# Patient Record
Sex: Female | Born: 1955 | Race: White | Hispanic: No | Marital: Single
Health system: Midwestern US, Community
[De-identification: ages and names within clinical notes are randomized; demographics above are authoritative.]

## PROBLEM LIST (undated history)

## (undated) DIAGNOSIS — M65321 Trigger finger, right index finger: Secondary | ICD-10-CM

## (undated) DIAGNOSIS — M19041 Primary osteoarthritis, right hand: Secondary | ICD-10-CM

## (undated) DIAGNOSIS — M19042 Primary osteoarthritis, left hand: Secondary | ICD-10-CM

---

## 2019-05-31 IMAGING — MR MRI JOINT OF LOWER EXTREMITY W/CONTRAST LT
4 of 5 series · 31 of 40 positions shown · IV contrast (Gadavist)
Comparison: None available.

EXAM:  MRI JOINT OF LOWER EXTREMITY W/CONTRAST LT
INDICATION: Left knee pain and swelling for several months.
TECHNIQUE: Multiplanar multisequential MRI of the left knee joint was performed following intra-articular administration of 40 mL of diluted Gadavist.

[Series 5: T1 fat-sat · axial · left · 4.0mm · 0.42mm/px · z∈[-69,+43]mm · 9 of 26 slices shown (1 of 2)]
[im 1/26]
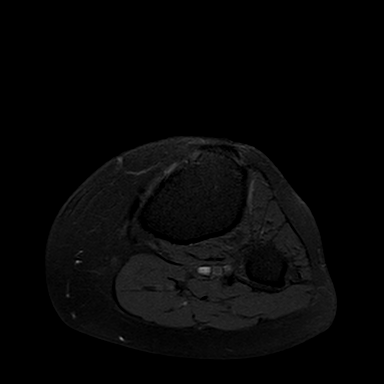
[im 4/26]
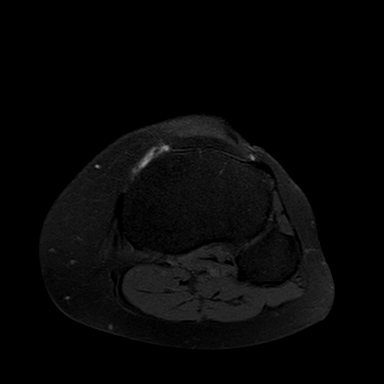
[im 7/26]
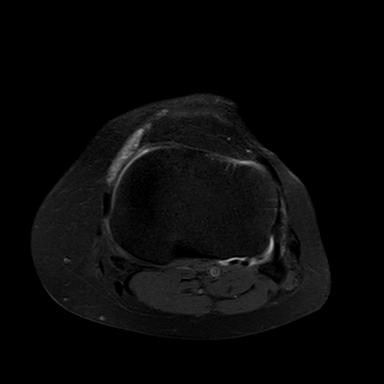
[im 10/26]
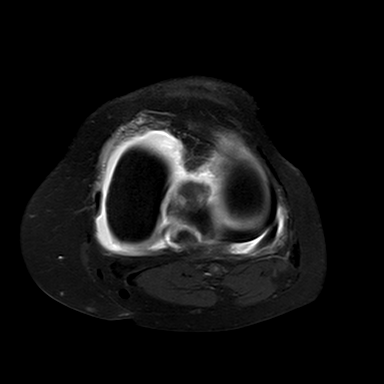
[im 13/26]
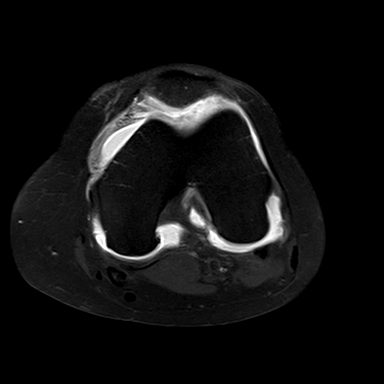
[im 16/26]
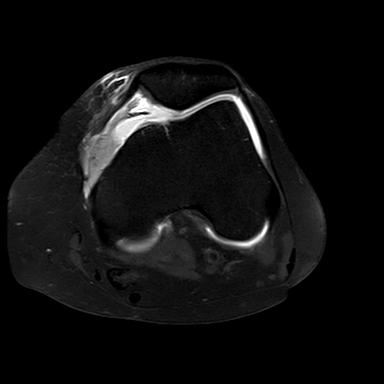
[im 19/26]
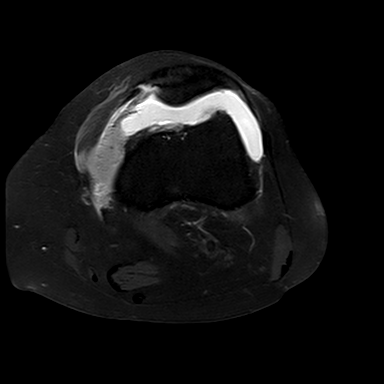
[im 22/26]
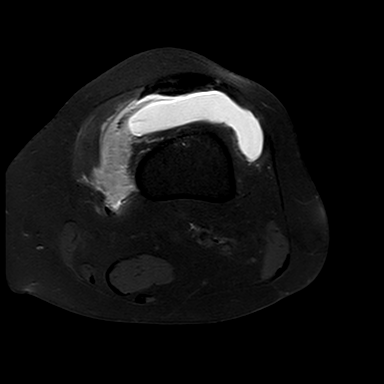
[im 26/26]
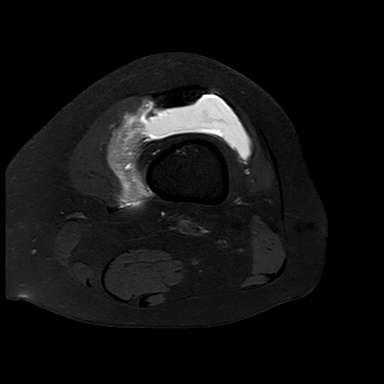

[Series 8: T1 fat-sat · sagittal · left · 4.0mm · 0.53mm/px · 9 of 26 slices shown (2 of 2)]
[im 1/26]
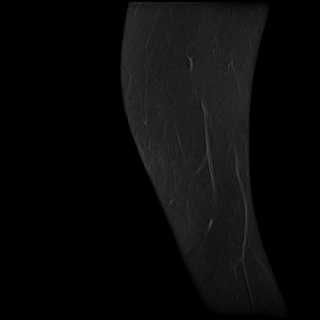
[im 4/26]
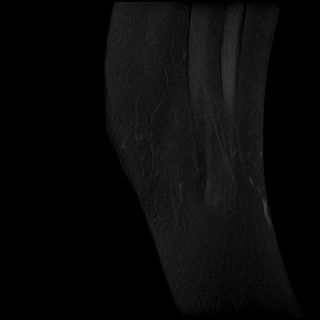
[im 7/26]
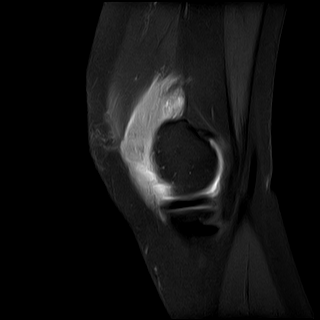
[im 10/26]
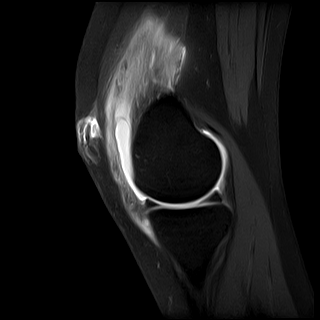
[im 13/26]
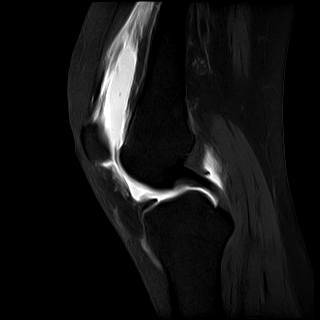
[im 16/26]
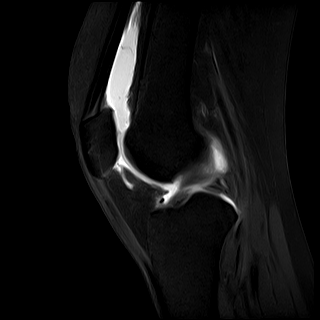
[im 19/26]
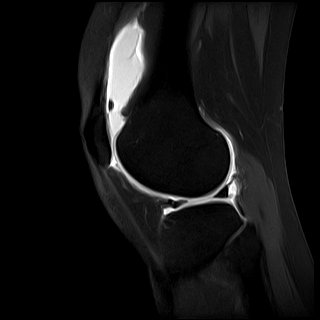
[im 22/26]
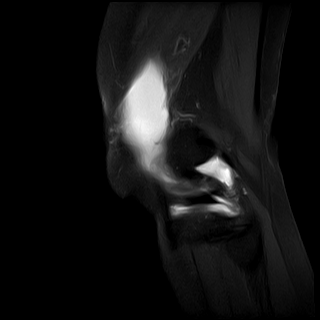
[im 26/26]
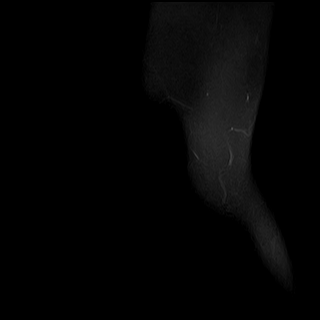

[Series 9: PD fat-sat · sagittal · left · 4.0mm · 0.33mm/px · 9 of 26 slices shown]
[im 1/26]
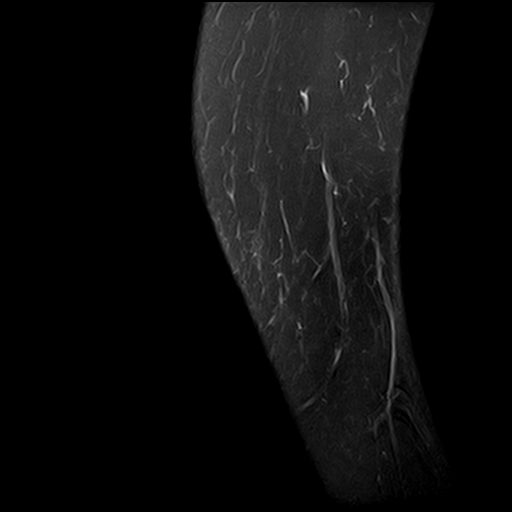
[im 4/26]
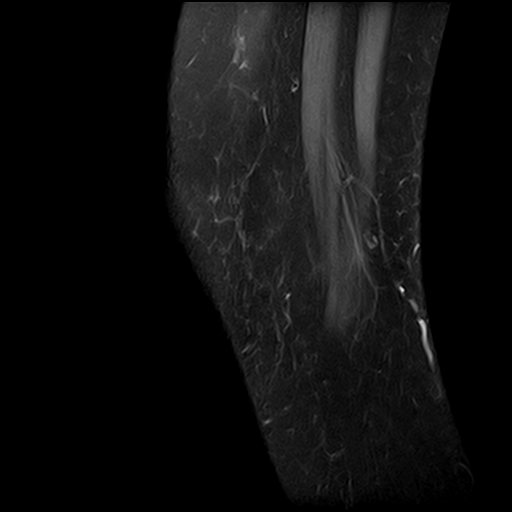
[im 7/26]
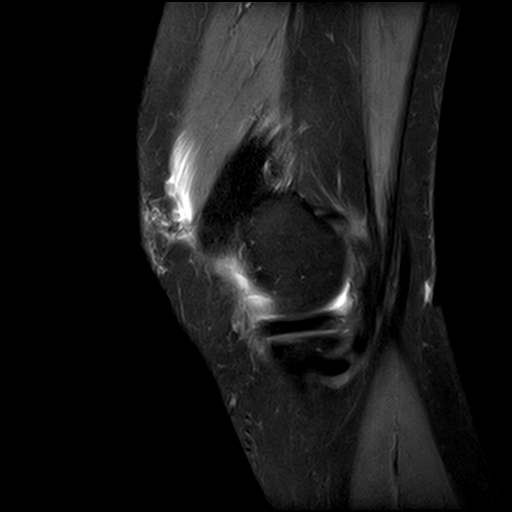
[im 10/26]
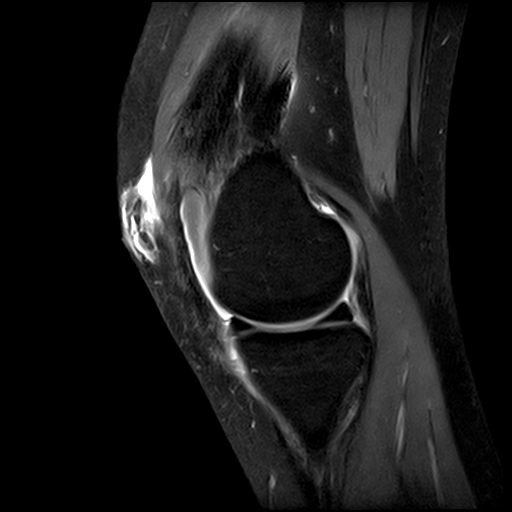
[im 13/26]
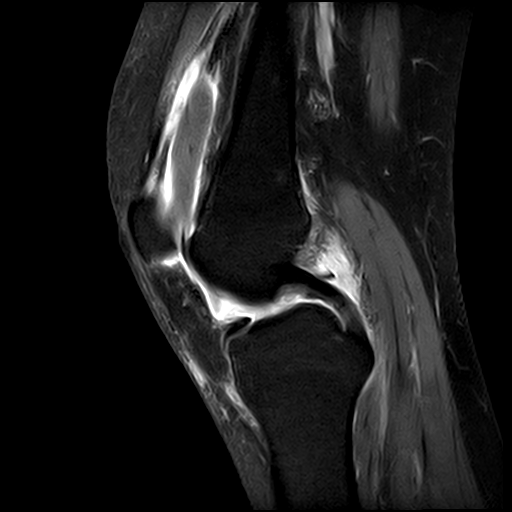
[im 16/26]
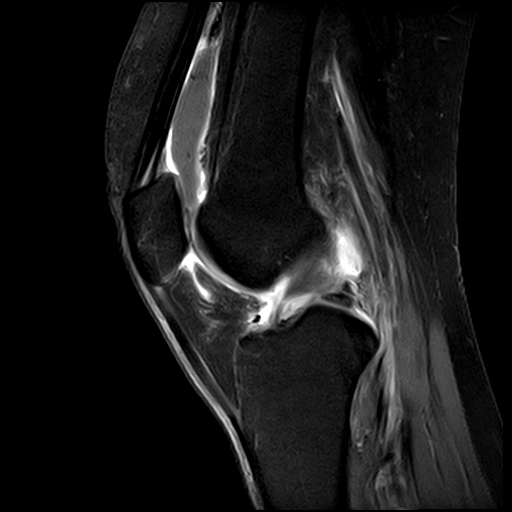
[im 19/26]
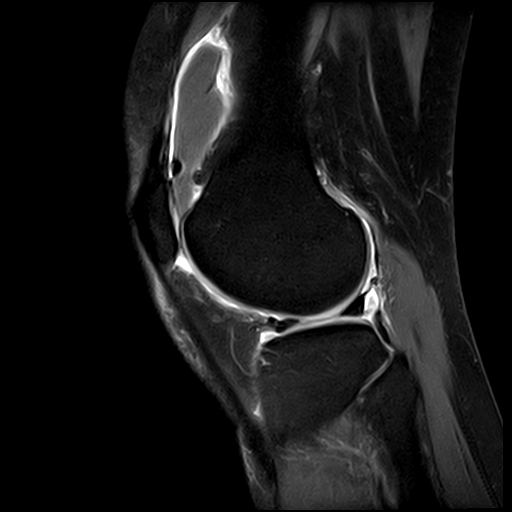
[im 22/26]
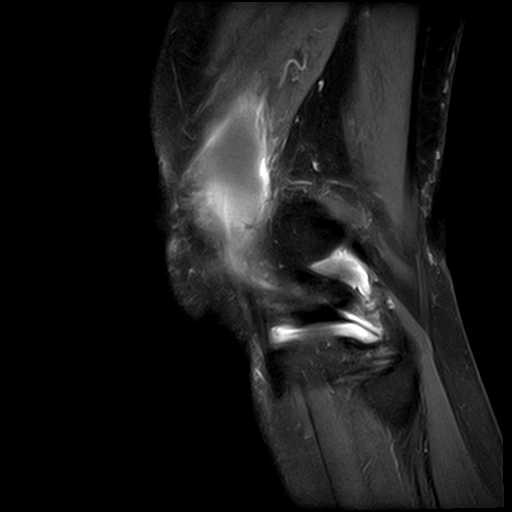
[im 26/26]
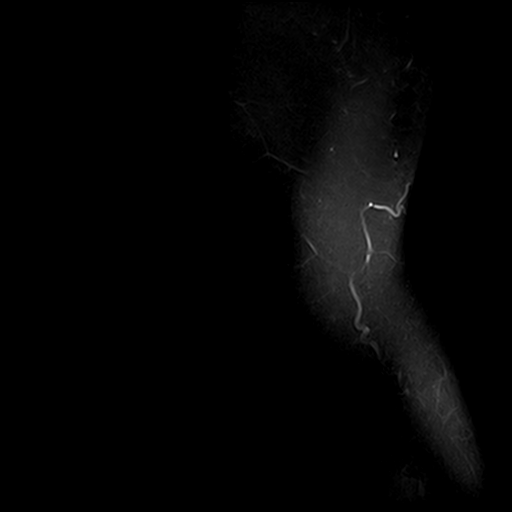

[Series 11: PD · sagittal · left · 2.0mm · 0.47mm/px · 4 of 11 slices shown]
[im 1/11]
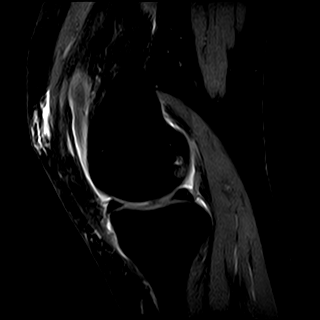
[im 4/11]
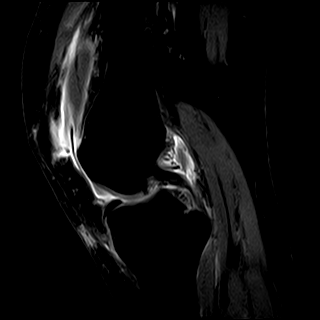
[im 7/11]
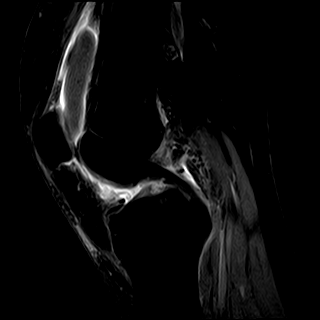
[im 11/11]
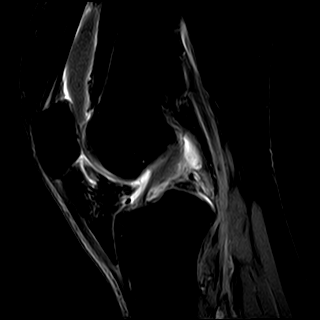

[31 of 40 positions shown; findings below may reference images not displayed]

FINDINGS: There is suggestion of a subtle horizontal tear involving the body of the lateral meniscus. Medial meniscus, cruciate and collateral ligaments are intact, within normal limits in morphology and signal intensity. Hyaline cartilage of the tibiofemoral and patellofemoral articulations is well maintained. Extensor mechanism is intact. Capsular attachments appear unremarkable. Bone marrow signal intensity is normal. There is no Baker's cyst.
IMPRESSION: Suggestion of a subtle horizontal tear involving the body of the lateral meniscus.

## 2020-03-27 IMAGING — MR MRI JOINT OF LOWER EXTREMITY W/CONTRAST RT
5 series · 31 of 40 positions shown · IV contrast (Gadavist)
Comparison: None available.

﻿EXAM:  MRI JOINT OF LOWER EXTREMITY W/CONTRAST RT
INDICATION: Right knee pain.
TECHNIQUE: Multiplanar multisequential MRI of the right knee joint was performed following intra-articular administration of 20 mL of diluted Gadavist.

[Series 5: T1 fat-sat · axial · right · 4.0mm · 0.42mm/px · z∈[-49,+68]mm · 8 of 27 slices shown (1 of 3)]
[im 1/27]
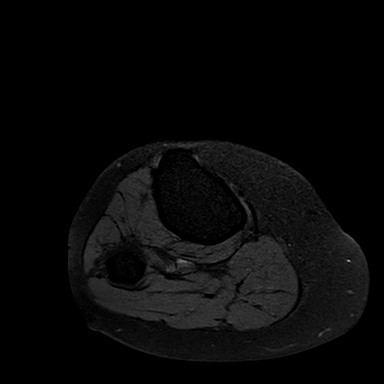
[im 3/27]
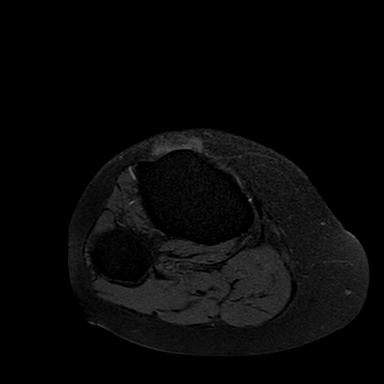
[im 9/27]
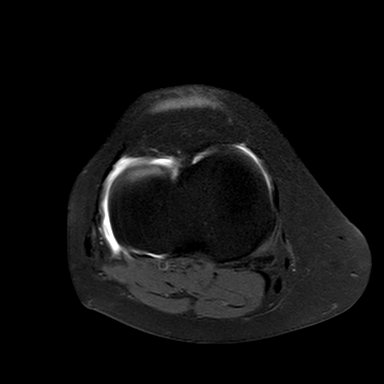
[im 12/27]
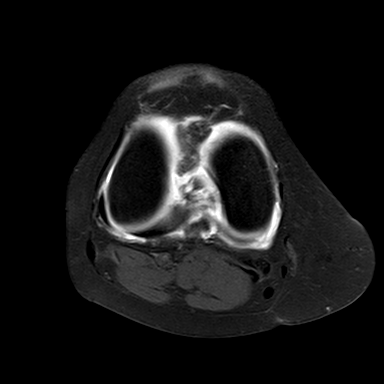
[im 15/27]
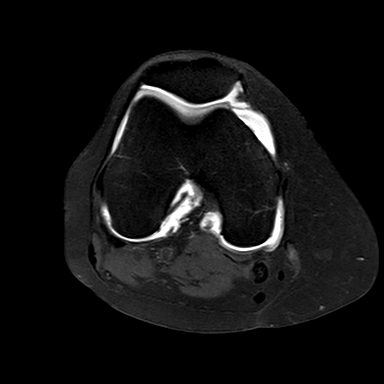
[im 18/27]
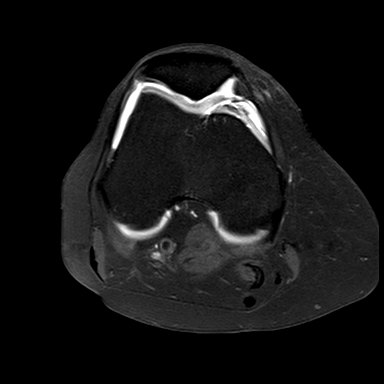
[im 24/27]
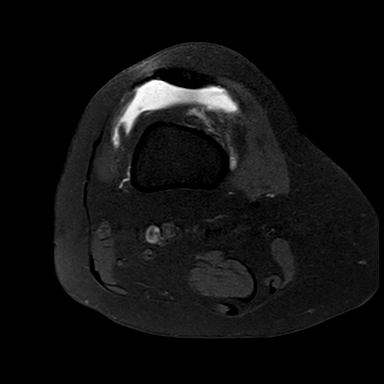
[im 27/27]
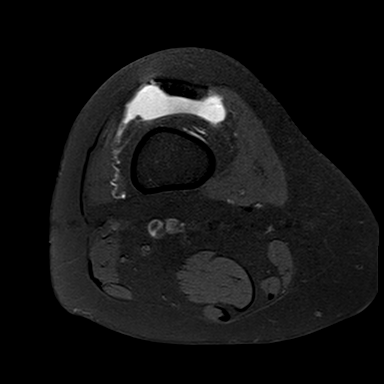

[Series 6: T1 fat-sat · sagittal · right · 4.0mm · 0.53mm/px · 8 of 23 slices shown (2 of 3)]
[im 1/23]
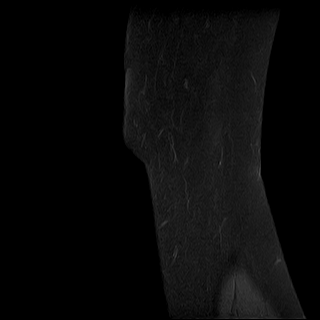
[im 4/23]
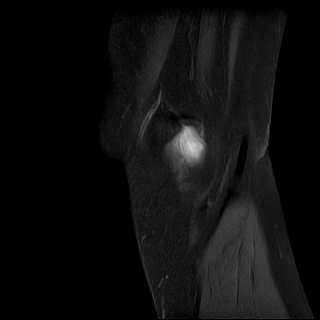
[im 7/23]
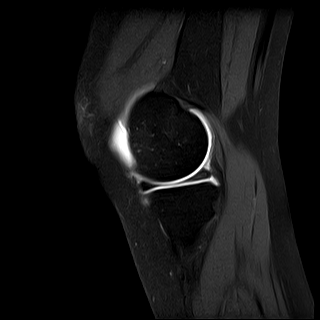
[im 10/23]
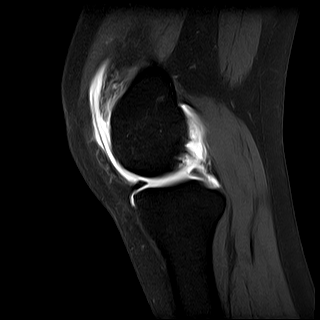
[im 13/23]
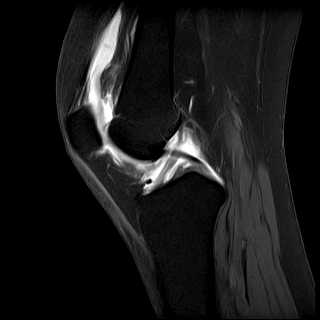
[im 16/23]
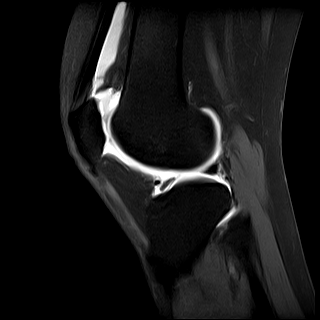
[im 19/23]
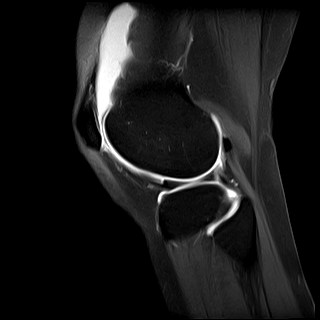
[im 23/23]
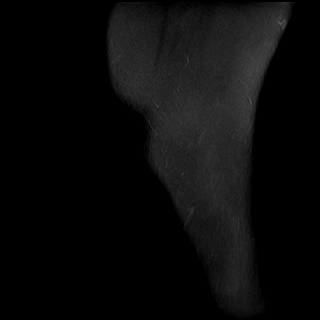

[Series 7: PD fat-sat · sagittal · right · 4.0mm · 0.33mm/px · 8 of 23 slices shown]
[im 1/23]
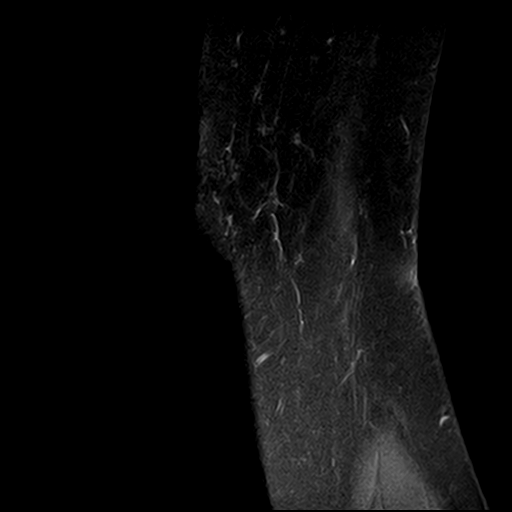
[im 4/23]
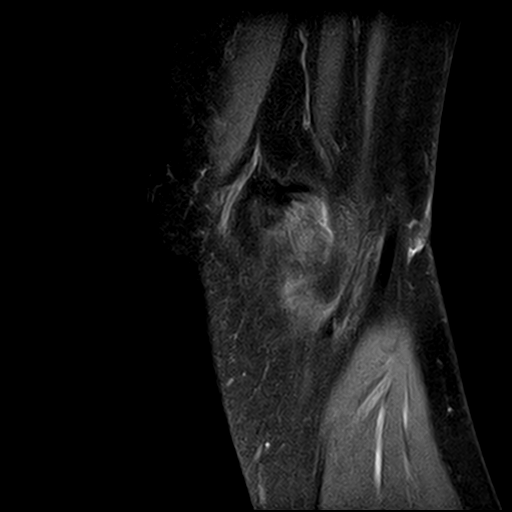
[im 7/23]
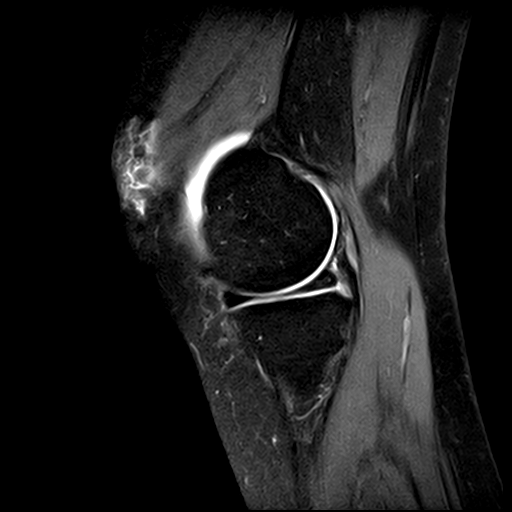
[im 10/23]
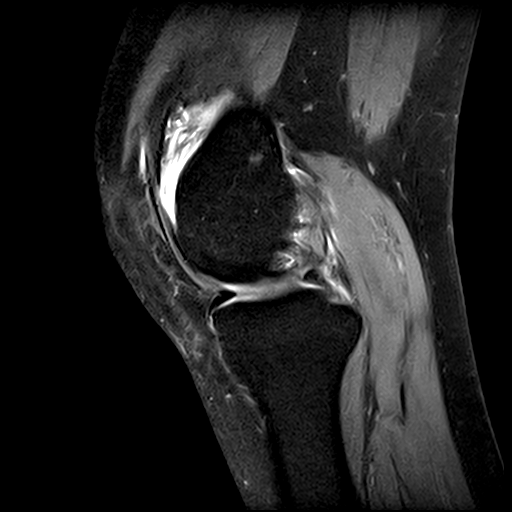
[im 13/23]
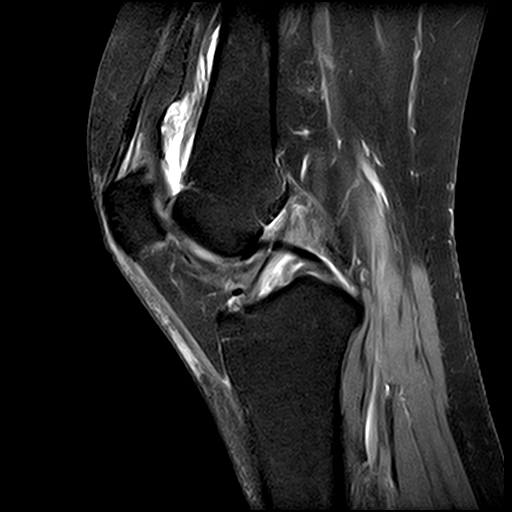
[im 16/23]
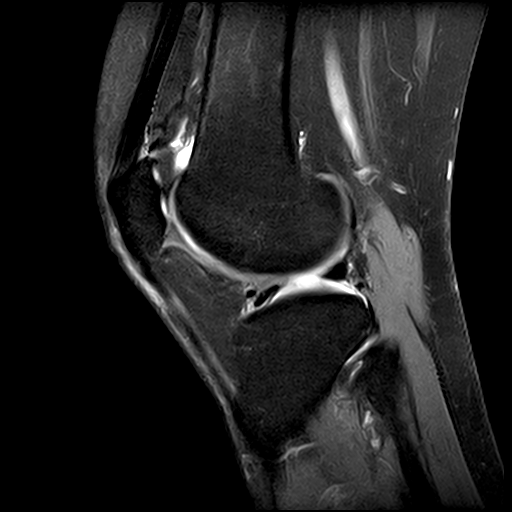
[im 19/23]
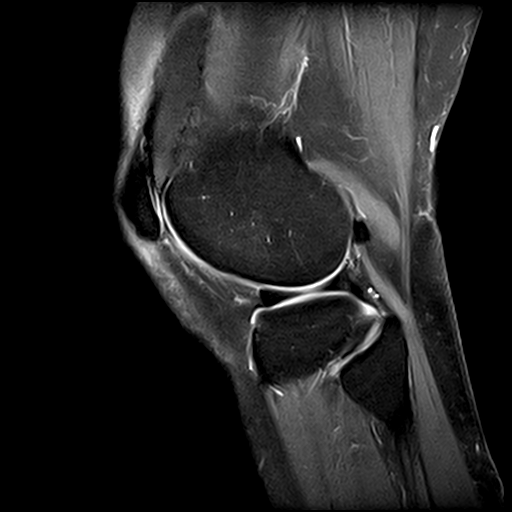
[im 23/23]
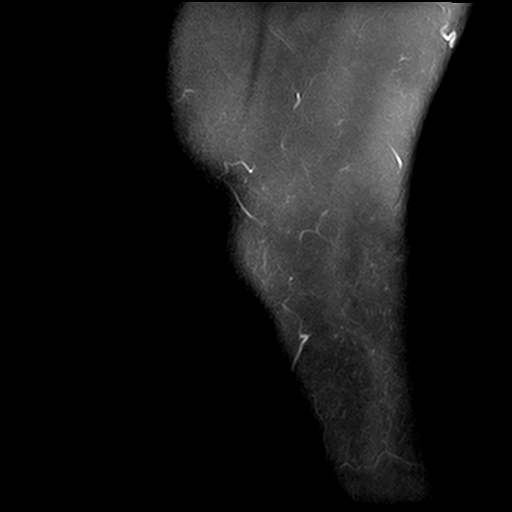

[Series 8: T1 fat-sat · coronal · right · 4.0mm · 0.38mm/px · 3 of 27 slices shown (3 of 3)]
[im 1/27]
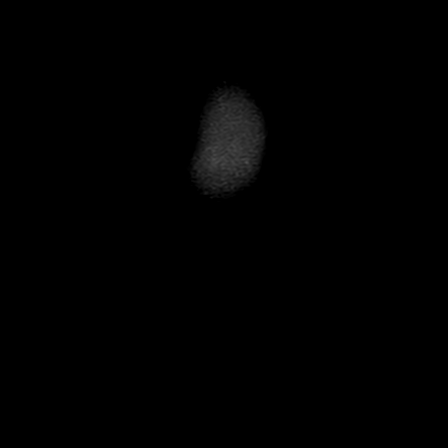
[im 3/27]
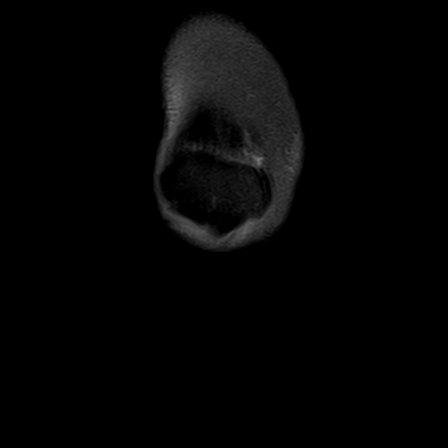
[im 9/27]
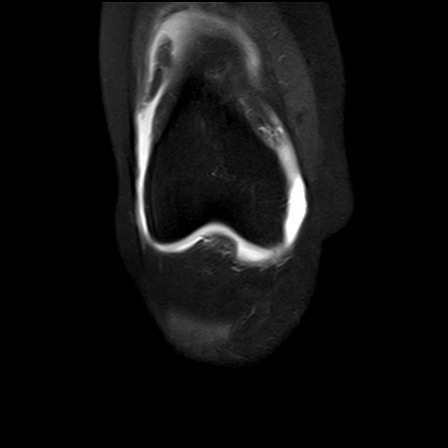

[Series 9: PD · sagittal · right · 2.0mm · 0.47mm/px · 4 of 11 slices shown]
[im 1/11]
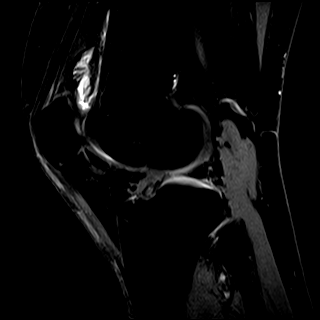
[im 4/11]
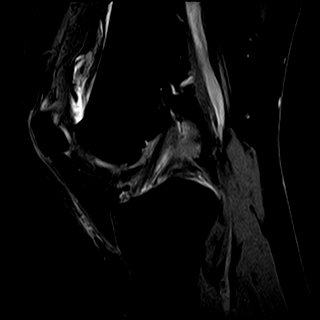
[im 7/11]
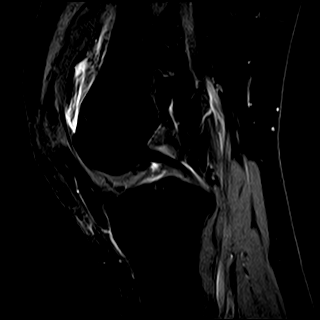
[im 11/11]
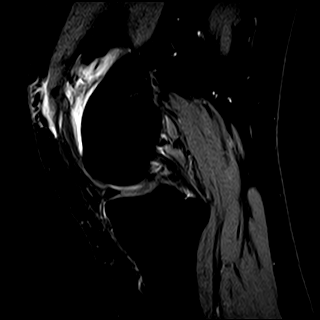

[31 of 40 positions shown; findings below may reference images not displayed]

FINDINGS: There is suggestion of a subtle tear involving the body of the medial meniscus. Lateral meniscus, cruciate and collateral ligaments are intact, within normal limits in morphology and signal intensity. Hyaline cartilage of the tibiofemoral and patellofemoral articulations is well maintained. Extensor mechanism is intact. Capsular attachments appear unremarkable. Bone marrow signal intensity is normal. There is no Baker's cyst.
IMPRESSION: Suggestion of a subtle tear involving the body of the medial meniscus.

## 2021-01-14 IMAGING — MR MRI WRIST LT W/O CONTRAST
5 of 7 series · 30 of 40 positions shown · IV contrast (gadolinium)
Comparison: None available.

﻿EXAM:  [id number]   MRI WRIST LT W/O CONTRAST
INDICATION: Pain and swelling.
TECHNIQUE: Multiplanar multisequential MRI of the left wrist joint was performed without gadolinium contrast.

[Series 6: T1 · axial · left · 3.0mm · 0.34mm/px · z∈[-21,+42]mm · 6 of 20 slices shown (1 of 2)]
[im 1/20]
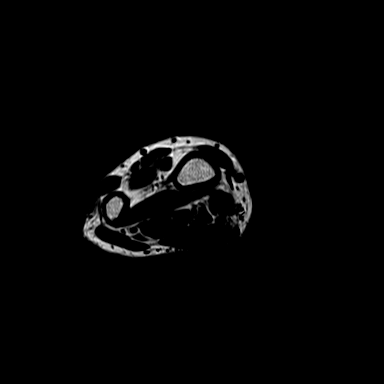
[im 4/20]
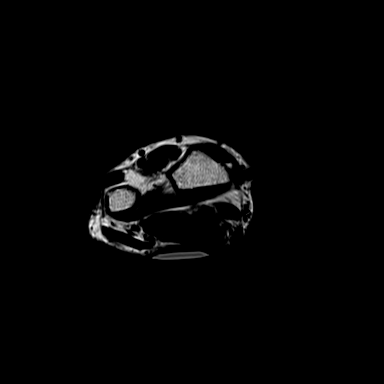
[im 8/20]
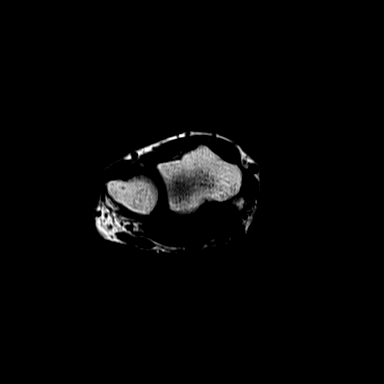
[im 12/20]
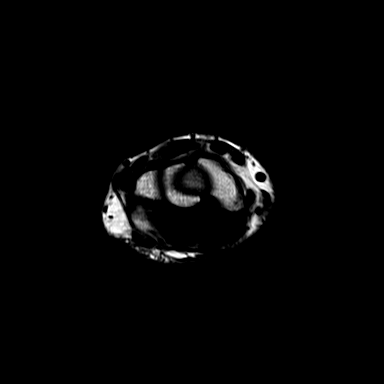
[im 16/20]
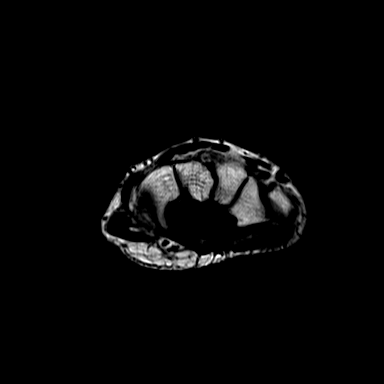
[im 20/20]
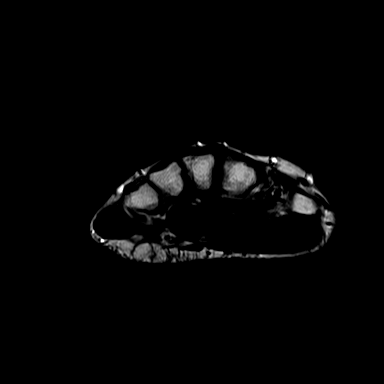

[Series 8: PD fat-sat · axial · left · 3.0mm · 0.29mm/px · z∈[-30,+46]mm · 7 of 24 slices shown (1 of 2)]
[im 1/24]
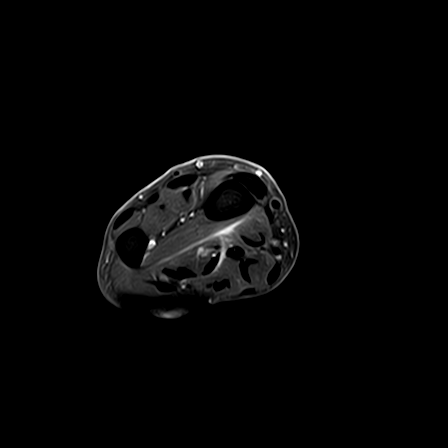
[im 4/24]
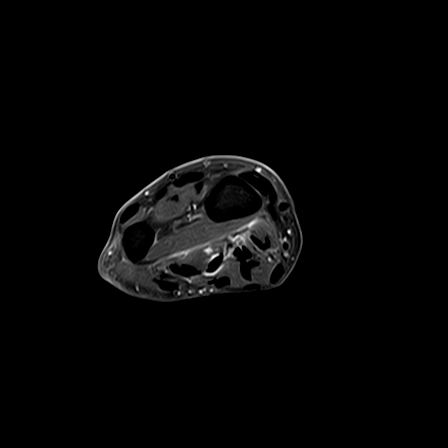
[im 8/24]
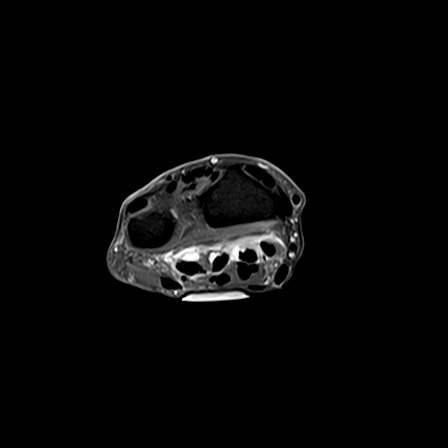
[im 12/24]
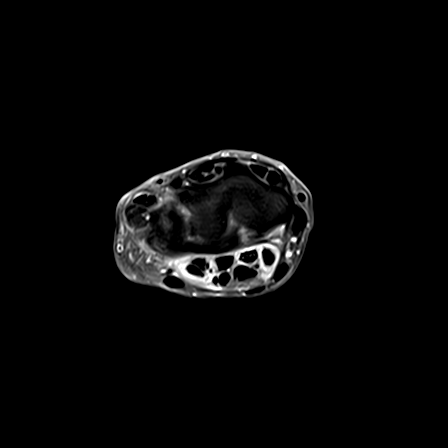
[im 16/24]
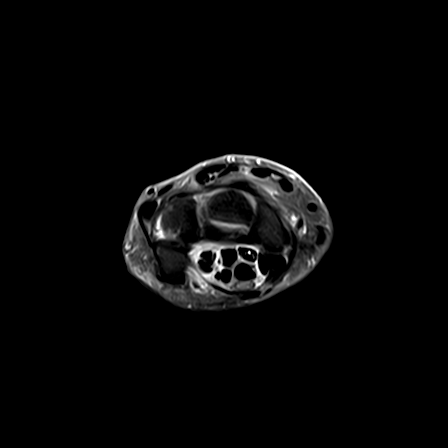
[im 20/24]
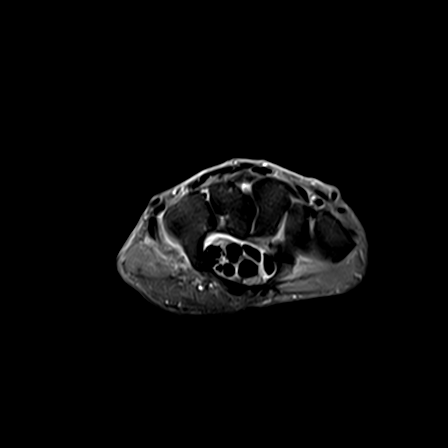
[im 24/24]
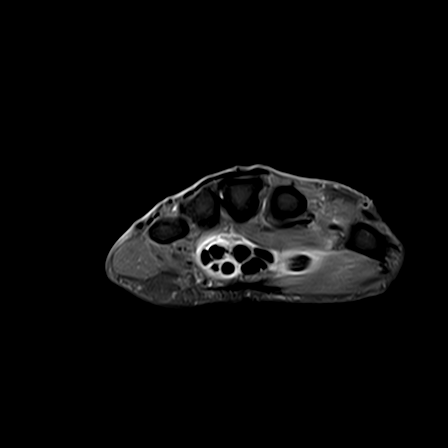

[Series 9: T1 · axial · left · 3.0mm · 0.34mm/px · z∈[-30,+46]mm · 7 of 24 slices shown (2 of 2)]
[im 1/24]
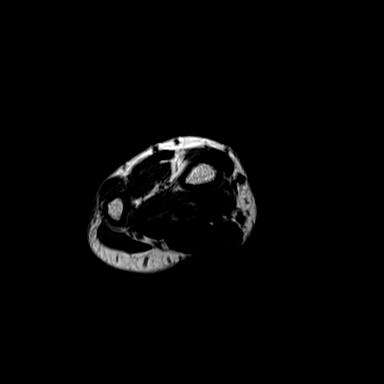
[im 4/24]
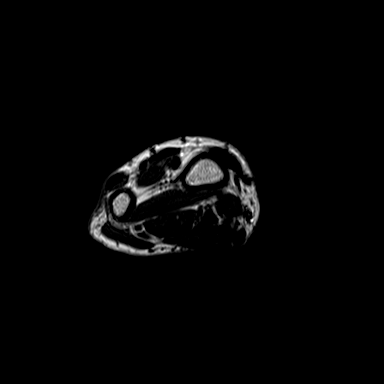
[im 8/24]
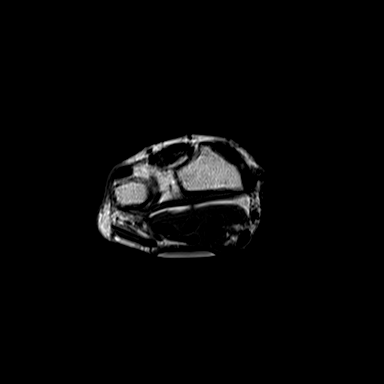
[im 12/24]
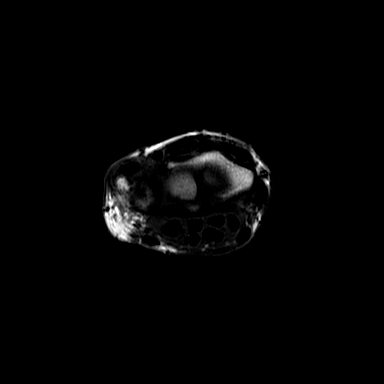
[im 16/24]
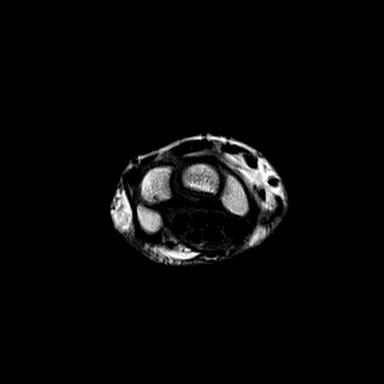
[im 20/24]
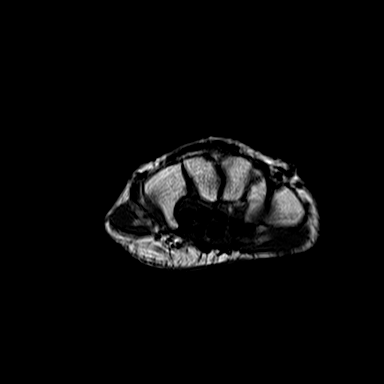
[im 24/24]
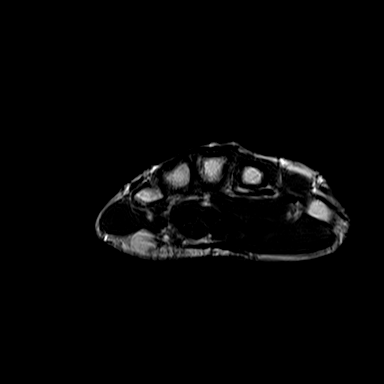

[Series 11: T2 fat-sat · sagittal · left · 3.0mm · 0.29mm/px · 6 of 22 slices shown]
[im 1/22]
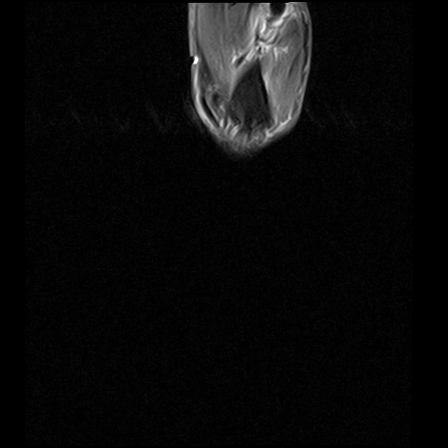
[im 5/22]
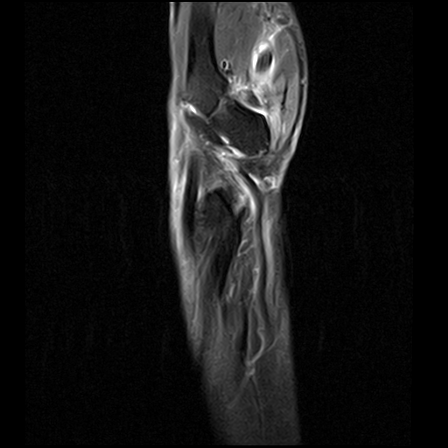
[im 9/22]
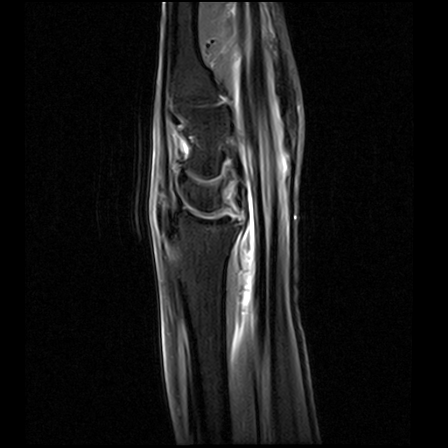
[im 13/22]
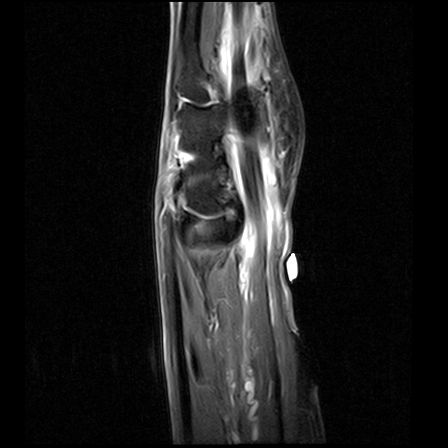
[im 17/22]
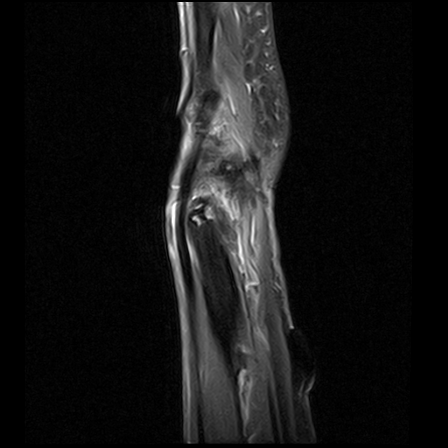
[im 22/22]
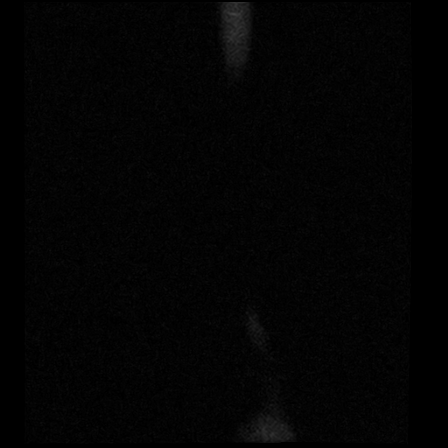

[Series 13: PD fat-sat · coronal · left · 3.5mm · 0.29mm/px · 4 of 14 slices shown (2 of 2)]
[im 1/14]
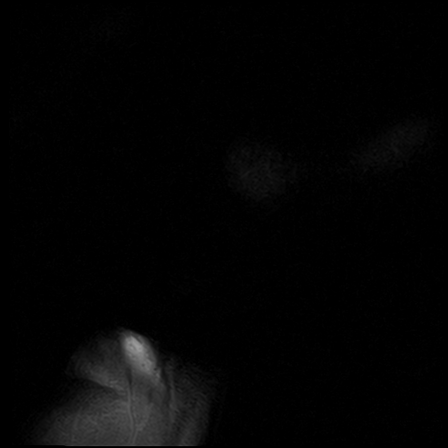
[im 5/14]
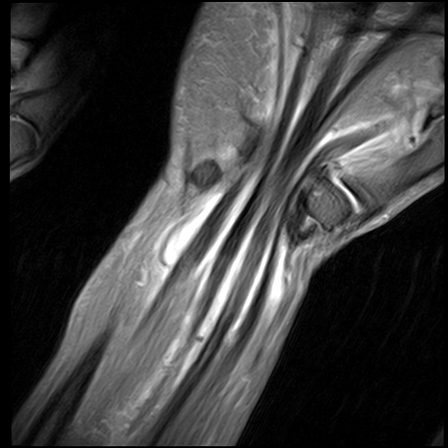
[im 9/14]
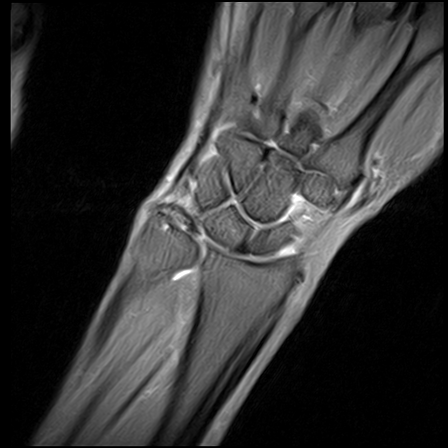
[im 14/14]
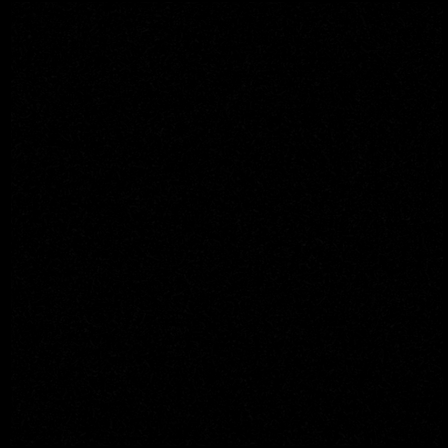

[30 of 40 positions shown; findings below may reference images not displayed]

FINDINGS: Examination is somewhat degraded due to excessive patient motion. Bone marrow signal intensity is normal. There is no acute fracture or subluxation. No significant arthritic changes are noted. Scapholunate and lunotriquetral ligaments are intact. There is a complex triangular fibrocartilage tear. Moderate amount of fluid is noted within all flexor tendon sheaths. Extensor tendons are normal. The median nerve appears unremarkable. There is no subcutaneous edema.
IMPRESSION: 1. Moderate amount of fluid within flexor tendon sheaths consistent with tenosynovitis. Continued follow-up is recommended. 

2. Complex triangular fibrocartilage tear.

## 2022-01-08 ENCOUNTER — Encounter: Payer: MEDICARE | Attending: Specialist

## 2022-01-08 DIAGNOSIS — M19042 Primary osteoarthritis, left hand: Secondary | ICD-10-CM

## 2022-01-08 NOTE — Progress Notes (Unsigned)
HPI: Jade Singleton (DOB: [birth year]) is a 66 y.o. female, patient, here for evaluation of the following chief complaint(s):    No chief complaint on file.   ***    Vitals:  There were no vitals taken for this visit.    There is no height or weight on file to calculate BMI.    Not on File    No current outpatient medications on file.     No current facility-administered medications for this visit.        No past medical history on file.     No past surgical history on file.    No family history on file.           Review of Systems   All other systems reviewed and are negative.           Physical Exam    ***    Imaging:    ***    ASSESSMENT/PLAN:  Below is the assessment and plan developed based on review of pertinent history, physical exam, labs, studies, and medications.    ***    1. Primary osteoarthritis of both hands  2. Left hand pain      No follow-ups on file.     An electronic signature was used to authenticate this note.  -- Leandro Reasoner, MD

## 2022-01-10 ENCOUNTER — Ambulatory Visit: Admit: 2022-01-10 | Discharge: 2022-01-15 | Payer: MEDICARE

## 2022-01-10 ENCOUNTER — Ambulatory Visit: Admit: 2022-01-10 | Discharge: 2022-01-10 | Payer: MEDICARE | Attending: Specialist

## 2022-01-10 DIAGNOSIS — M79642 Pain in left hand: Secondary | ICD-10-CM

## 2022-01-10 MED ORDER — METHYLPREDNISOLONE 4 MG PO TBPK
4 MG | PACK | ORAL | 0 refills | Status: AC
Start: 2022-01-10 — End: 2022-03-11

## 2022-01-10 NOTE — Patient Instructions (Signed)
Patient Education        Trigger Finger Release: Before Your Surgery  What is trigger finger release?  Trigger finger release is surgery to make it easier to bend and straighten your finger. Your doctor will make a cut in the tissue over the tendon that helps bend your finger. This cut is called an incision. It will allow the tendon to move freely without pain.  This surgery will probably be done while you are awake. The doctor will give you a shot (injection) to numb your hand and prevent pain. You also may get medicine to help you relax.  The doctor will make an incision in the skin of your finger or palm. The doctor will make a cut to open the tissue over the swollen part of the tendon. The doctor will close the skin incision with stitches. After surgery, you will have a small scar on your finger or palm. This will fade with time.  It will probably take about 6 weeks for your hand to heal. After it heals, your finger may move easily without pain.  You will go home the same day as the surgery. How soon you can return to work depends on your job. If you can do your job without using your hand, you may be able to go back in 1 or 2 days. But if your job requires you to do repeated finger or hand movements, put pressure on your hand, or lift things, you may need to take more time off work.  How do you prepare for surgery?  Surgery can be stressful. This information will help you understand what you can expect. And it will help you safely prepare for surgery.  Preparing for surgery    You may need to shower or bathe with a special soap the night before and the morning of your surgery. The soap contains chlorhexidine. It reduces the amount of bacteria on your skin that could cause an infection after surgery.     Be sure you have someone to take you home. Anesthesia and pain medicine will make it unsafe for you to drive or get home on your own.     Understand exactly what surgery is planned, along with the risks,  benefits, and other options.     If you take a medicine that prevents blood clots, your doctor may tell you to stop taking it before your surgery. Or your doctor may tell you to keep taking it. (These medicines include aspirin and other blood thinners.) Make sure that you understand exactly what your doctor wants you to do.     Tell your doctor ALL the medicines, vitamins, supplements, and herbal remedies you take. Some may increase the risk of problems during your surgery. Your doctor will tell you if you should stop taking any of them before the surgery and how soon to do it.     Make sure your doctor and the hospital have a copy of your advance directive. If you don't have one, you may want to prepare one. It lets others know your health care wishes. It's a good thing to have before any type of surgery or procedure.   What happens on the day of surgery?    Follow the instructions exactly about when to stop eating and drinking. If you don't, your surgery may be canceled. If your doctor told you to take your medicines on the day of surgery, take them with only a sip of water.       Follow your doctor's instructions about when to bathe or shower before your surgery. Do not apply lotions, perfumes, deodorants, or nail polish.     Do not shave the surgical site yourself.     Take off all jewelry and piercings. And take out contact lenses, if you wear them.   At the hospital or surgery center    Bring a picture ID..     The area for surgery is often marked to make sure there are no errors.     You will be kept comfortable and safe by your anesthesia provider. You may get medicine that relaxes you or puts you in a light sleep. The area being worked on will be numb.     The surgery will take less than 1 hour.   When should you call your doctor?   You have questions or concerns.     You don't understand how to prepare for your surgery.     You become ill before the surgery (such as fever, flu, or a cold).     You need to  reschedule or have changed your mind about having the surgery.   Current as of: May 08, 2021               Content Version: 13.7   2006-2023 [employer].   Care instructions adapted under license by [facility]. If you have questions about a medical condition or this instruction, always ask your healthcare professional. [employer] disclaims any warranty or liability for your use of this information.

## 2022-01-10 NOTE — Progress Notes (Signed)
HPI: Jade Singleton (DOB: 10-Mar-1956) is a 66 y.o. female, patient, here for evaluation of the following chief complaint(s):    Hand Pain (Pt has bilateral hand pain.  She had no known injury.  She has been told she has trigger finger in some of these fingers.)  Patient is seen today to evaluate her hands.  Patient reports a history of having an left index trigger finger release by another surgeon many years ago.  Since that time she feels she has had problems with her hands.  She has developed flexion contractures of the DIP joints of both small fingers and is more stiff at the PIP level of the left small finger.  She cannot make a full composite fist on the left hand due to stiffness in the PIP DIP regions from arthritis of the left index and small finger.  She can basically make a full fist with her right hand.  She does describe some clicking and locking involving the right index and middle finger and the left small finger.  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    Vitals:  Ht _0  (1.651 m)   Wt 160 lb (72.6 kg)   BMI 26.63 kg/m     Body mass index is 26.63 kg/m.    Allergies   Allergen Reactions    Sulfa Antibiotics Rash       Current Outpatient Medications   Medication Sig Dispense Refill    ibuprofen (ADVIL;MOTRIN) 800 MG tablet Take 1 tablet by mouth as needed for Pain      doxycycline hyclate (PERIOSTAT) 20 MG tablet Take 1 tablet by mouth      VITAMIN D PO Take by mouth      methylPREDNISolone (MEDROL DOSEPACK) 4 MG tablet Take by mouth as directed. 1 kit 0     No current facility-administered medications for this visit.        History reviewed. No pertinent past medical history.     History reviewed. No pertinent surgical history.    History reviewed. No pertinent family history.           Review of Systems   All other systems reviewed and are negative.           Physical Exam    Left hand: DJD left index DIP joint with ulnar deviation 10 to 15 degrees and PIP  flexion of about 60.  Middle and ring finger of good range of motion.  Left small finger has limited PIP flexion of about 45 to 50 degrees and DIP is flexed 55 degrees.  Left small finger does have some tenderness at the A1 pulley.  She cannot make a full composite fist.    Right hand: Tenderness A1 pulley right index and middle finger with clicking more than locking.  Right small finger is DIP flexion of 50 to 55 degrees.  She can make a much better composite fist on the right hand.    Imaging:    XR Results (most recent):  XR HAND BILATERAL MINIMUM 3 VW 01/10/2022    Narrative  AP, lateral and oblique x-ray of both hands were obtained.  She is radiographic evidence of osteoarthritis mostly in the DIP of the index finger with joint space loss and ulnar deviation of 10 to 15 degrees.  There is flexion of 35 to 40 degrees at the DIP joints of each small finger.  There is narrowing of the PIP especially of the left  small finger consistent with osteoarthritis.  Mild osteopenic changes and DIP PIP narrowings but no fractures.         ASSESSMENT/PLAN:  Below is the assessment and plan developed based on review of pertinent history, physical exam, labs, studies, and medications.    Patient examination was consistent with a combination of osteoarthritis and stenosing tenosynovitis.  Her arthritis is mostly involving the left index DIP joint but she has fairly fixed flexion deformities of both small finger DIP joints as well as the left small finger PIP joint.  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  She has had several "shots" in the past for both hands and at this point would rather consider surgical management.  I outlined to her in total a treatment plan for each hand.  The left hand can be treated with a small trigger finger release and fusions of the left index DIP joint, and left small finger PIP and DIP joints.  The right hand can be treated  with index and middle trigger finger releases and a right small finger DIP fusion.  I explained that the goal will be to help improve pain and function but she would still have an incomplete fist in either hand especially more on the left side where she already has an incomplete fist.  I reviewed risks that include but are not limited to stiffness, pain, nerve or tendon damage and overall incomplete relief of pain.  She may trial a Medrol Dosepak.  Arrangements can be made for this to be performed on an outpatient basis at her convenience.  Lastly, she was seen with her sister who lives in the [place] area but the patient herself lives in [place].    1. Left hand pain  -     XR HAND BILATERAL MINIMUM 3 VW; Future  -     methylPREDNISolone (MEDROL DOSEPACK) 4 MG tablet; Take by mouth as directed., Disp-1 kit, R-0Normal  2. Right hand pain  -     XR HAND BILATERAL MINIMUM 3 VW; Future  3. Primary osteoarthritis of both hands  4. Trigger index finger of right hand  5. Trigger finger, right middle finger  6. Trigger finger, left little finger      Return for Follow-up 7-10 days after surgery.     An electronic signature was used to authenticate this note.  -- Terald Sleeper, MD

## 2022-01-14 ENCOUNTER — Encounter

## 2022-01-27 NOTE — Telephone Encounter (Signed)
Pt called asking about a code that was sent to her, however she is unsure from whom it came and didn't get the complete code. Advised this message may have actually come from the surgery center and to call them for this information. She also states that she has been thinking about the surgery and would like to take off the procedures for index and small finger fusions and proceed only with the left small trigger finger release. Sent message to [facility] about procedure.

## 2022-02-12 ENCOUNTER — Encounter

## 2022-02-12 MED ORDER — HYDROCODONE-ACETAMINOPHEN 5-325 MG PO TABS
5-325 MG | ORAL_TABLET | ORAL | 0 refills | Status: AC | PRN
Start: 2022-02-12 — End: 2022-02-19

## 2022-02-12 NOTE — Telephone Encounter (Signed)
Postoperative pain medication prescribed. PMP checked.     Johncarlo Maalouf, PA-C

## 2022-02-13 ENCOUNTER — Encounter

## 2022-02-13 MED ORDER — HYDROCODONE-ACETAMINOPHEN 5-325 MG PO TABS
5-325 MG | ORAL_TABLET | ORAL | 0 refills | Status: AC | PRN
Start: 2022-02-13 — End: 2022-02-20

## 2022-02-20 ENCOUNTER — Ambulatory Visit: Admit: 2022-02-20 | Discharge: 2022-02-20 | Payer: MEDICARE | Attending: Specialist

## 2022-02-20 DIAGNOSIS — M19041 Primary osteoarthritis, right hand: Secondary | ICD-10-CM

## 2022-02-20 NOTE — Patient Instructions (Signed)
Patient Education        Hand Arthritis: Exercises  Introduction  Here are some examples of exercises for you to try. The exercises may be suggested for a condition or for rehabilitation. Start each exercise slowly. Ease off the exercises if you start to have pain.  You will be told when to start these exercises and which ones will work best for you.  How to do the exercises  Tendon glides    In this exercise, the steps follow one another to a make a continuous movement.  With your affected hand, point your fingers and thumb straight up. Your wrist should be relaxed, following the line of your fingers and thumb.  Curl your fingers so that the top two joints in them are bent, and your fingers wrap down. Your fingertips should touch or be near the base of your fingers. Your fingers will look like a hook.  Make a fist by bending your knuckles. Your thumb can gently rest against your index (pointing) finger.  Unwind your fingers slightly so that your fingertips can touch the base of your palm. Your thumb can rest against your index finger.  Move back to your starting position, with your fingers and thumb pointing up.  Repeat the series of motions 8 to 12 times.  Switch hands and repeat steps 1 through 6, even if only one hand is sore.  Intrinsic flexion    Rest your affected hand on a table and bend the large joints where your fingers connect to your hand. Keep your thumb and the other joints in your fingers straight.  Slowly straighten your fingers. Your wrist should be relaxed, following the line of your fingers and thumb.  Move back to your starting position, with your hand bent.  Repeat 8 to 12 times.  Switch hands and repeat steps 1 through 4, even if only one hand is sore.  Finger extension    Place your affected hand flat on a table.  Lift and then lower one finger at a time off the table.  Repeat 8 to 12 times.  Switch hands and repeat steps 1 through 3, even if only one hand is sore.  MP extension    Place your  good hand on a table, palm up. Put your affected hand on top of your good hand with your fingers wrapped around the thumb of your good hand like you are making a fist.  Slowly uncurl the joints of your affected hand where your fingers connect to your hand so that only the top two joints of your fingers are bent. Your fingers will look like a hook.  Move back to your starting position, with your fingers wrapped around your good thumb.  Repeat 8 to 12 times.  Switch hands and repeat steps 1 through 4, even if only one hand is sore.  PIP extension (with MP extension)    Place your good hand on a table, palm up. Put your affected hand on top of your good hand, palm up.  Use the thumb and fingers of your good hand to grasp below the middle joint of one finger of your affected hand.  Straighten the last two joints of that finger.  Repeat 8 to 12 times.  Repeat steps 1 through 4 with each finger.  Switch hands and repeat steps 1 through 5, even if only one hand is sore.  DIP flexion    With your good hand, grasp one finger of your affected hand.   Your thumb will be on the top side of your finger just below the joint that is closest to your fingernail.  Slowly bend your affected finger only at the joint closest to your fingernail.  Repeat 8 to 12 times.  Repeat steps 1 through 3 with each finger.  Switch hands and repeat steps 1 through 4, even if only one hand is sore.  Follow-up care is a key part of your treatment and safety. Be sure to make and go to all appointments, and call your doctor if you are having problems. It's also a good idea to know your test results and keep a list of the medicines you take.  Current as of: May 08, 2021               Content Version: 13.7   2006-2023 [employer].   Care instructions adapted under license by [facility]. If you have questions about a medical condition or this instruction, always ask your healthcare professional. [employer] disclaims any  warranty or liability for your use of this information.

## 2022-02-20 NOTE — Progress Notes (Signed)
HPI: Jade Singleton (DOB: 20-Jun-1956) is a 66 y.o. female, patient, here for evaluation of the following chief complaint(s):    No chief complaint on file.  Patient is seen today to evaluate her hands.  Patient reports a history of having an left index trigger finger release by another surgeon many years ago.  Since that time she feels she has had problems with her hands.  She has developed flexion contractures of the DIP joints of both small fingers and is more stiff at the PIP level of the left small finger.  She cannot make a full composite fist on the left hand due to stiffness in the PIP DIP regions from arthritis of the left index and small finger.  She can basically make a full fist with her right hand.  She does describe some clicking and locking involving the right index and middle finger and the left small finger.  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  She elected to undergo a left small trigger finger A1 pulley release on 02/13/2022.    Vitals:  There were no vitals taken for this visit.    There is no height or weight on file to calculate BMI.    Allergies   Allergen Reactions    Sulfa Antibiotics Rash       Current Outpatient Medications   Medication Sig Dispense Refill    HYDROcodone-acetaminophen (NORCO) 5-325 MG per tablet Take 1 tablet by mouth every 4 hours as needed for Pain (Post op supervising physician Terald Sleeper, MD [phone]) for up to 7 days. Max Daily Amount: 6 tablets 15 tablet 0    ibuprofen (ADVIL;MOTRIN) 800 MG tablet Take 1 tablet by mouth as needed for Pain      doxycycline hyclate (PERIOSTAT) 20 MG tablet Take 1 tablet by mouth      VITAMIN D PO Take by mouth      methylPREDNISolone (MEDROL DOSEPACK) 4 MG tablet Take by mouth as directed. 1 kit 0     No current facility-administered medications for this visit.        History reviewed. No pertinent past medical history.     History reviewed. No pertinent surgical history.    History reviewed. No  pertinent family history.           Review of Systems   All other systems reviewed and are negative.           Physical Exam    Left hand: DJD left index DIP joint with ulnar deviation 10 to 15 degrees and PIP flexion of about 60.  Middle and ring finger of good range of motion.  Left small finger has limited PIP flexion of about 45 to 50 degrees and DIP is flexed 55 degrees.  Left small finger does have some tenderness at the A1 pulley.  She cannot make a full composite fist.    Right hand: Tenderness A1 pulley right index and middle finger with clicking more than locking.  Right small finger is DIP flexion of 50 to 55 degrees.  She can make a much better composite fist on the right hand.    Postoperatively: The left small finger wound is healing well.  There is no redness drainage or sign infection.  No locking.  Imaging:    XR Results (most recent):  XR HAND BILATERAL MINIMUM 3 VW 01/10/2022    Narrative  AP, lateral and oblique x-ray of both hands were obtained.  She is  radiographic evidence of osteoarthritis mostly in the DIP of the index finger with joint space loss and ulnar deviation of 10 to 15 degrees.  There is flexion of 35 to 40 degrees at the DIP joints of each small finger.  There is narrowing of the PIP especially of the left small finger consistent with osteoarthritis.  Mild osteopenic changes and DIP PIP narrowings but no fractures.         ASSESSMENT/PLAN:  Below is the assessment and plan developed based on review of pertinent history, physical exam, labs, studies, and medications.    Patient examination was consistent with a combination of osteoarthritis and stenosing tenosynovitis.  Her arthritis is mostly involving the left index DIP joint but she has fairly fixed flexion deformities of both small finger DIP joints as well as the left small finger PIP joint.  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  She has had several  "shots" in the past for both hands and at this point would rather consider surgical management.  I outlined to her in total a treatment plan for each hand.  The left hand can be treated with a small trigger finger release and fusions of the left index DIP joint, and left small finger PIP and DIP joints.  The right hand can be treated with index and middle trigger finger releases and a right small finger DIP fusion.  I explained that the goal will be to help improve pain and function but she would still have an incomplete fist in either hand especially more on the left side where she already has an incomplete fist.  I reviewed risks that include but are not limited to stiffness, pain, nerve or tendon damage and overall incomplete relief of pain.  She may trial a Medrol Dosepak.  Arrangements can be made for this to be performed on an outpatient basis at her convenience.  Lastly, she was seen with her sister who lives in the [place] area but the patient herself lives in [place].    She elected only undergo the left small trigger finger A1 pulley release on 02/13/2022.  Sutures removed on 02/20/2022.  She requested therapy and a prescription was provided that she will pursue in [place].  She does understand with the PIP DIP arthritis she will likely never be able to achieve a full composite fist.  Continue with hand motion and strengthening.  If patient does return to [place] at some point she can return in 4 to 6 weeks or anytime for further treatment.  Future consideration may still be given for effusions as discussed previously if necessary.      1. Primary osteoarthritis of both hands  2. Trigger index finger of right hand  3. Trigger finger, right middle finger  4. Trigger finger, left little finger  -     Amb External Referral To Physical Therapy  5. Status post trigger finger release      Return in about 4 weeks (around 03/20/2022).     An electronic signature was used to authenticate this  note.  -- Terald Sleeper, MD
